# Patient Record
Sex: Female | Born: 1960 | Race: White | Hispanic: No | Smoking: Former smoker
Health system: Southern US, Community
[De-identification: ages and names within clinical notes are randomized; demographics above are authoritative.]

## PROBLEM LIST (undated history)

## (undated) DIAGNOSIS — F32A Depression, unspecified: Secondary | ICD-10-CM

## (undated) DIAGNOSIS — E119 Type 2 diabetes mellitus without complications: Secondary | ICD-10-CM

## (undated) DIAGNOSIS — F419 Anxiety disorder, unspecified: Secondary | ICD-10-CM

## (undated) DIAGNOSIS — F329 Major depressive disorder, single episode, unspecified: Secondary | ICD-10-CM

## (undated) DIAGNOSIS — M25519 Pain in unspecified shoulder: Secondary | ICD-10-CM

## (undated) DIAGNOSIS — R1011 Right upper quadrant pain: Secondary | ICD-10-CM

## (undated) DIAGNOSIS — K219 Gastro-esophageal reflux disease without esophagitis: Secondary | ICD-10-CM

## (undated) DIAGNOSIS — E785 Hyperlipidemia, unspecified: Secondary | ICD-10-CM

## (undated) HISTORY — PX: DIAGNOSTIC LAPAROSCOPY: SUR761

## (undated) HISTORY — PX: CHOLECYSTECTOMY: SHX55

## (undated) HISTORY — PX: EYE SURGERY: SHX253

## (undated) HISTORY — PX: COLONOSCOPY: SHX174

---

## 2003-11-02 ENCOUNTER — Other Ambulatory Visit: Payer: Self-pay

## 2009-08-21 ENCOUNTER — Ambulatory Visit: Payer: Self-pay | Admitting: Internal Medicine

## 2009-08-26 ENCOUNTER — Ambulatory Visit: Payer: Self-pay | Admitting: Internal Medicine

## 2009-09-23 ENCOUNTER — Ambulatory Visit: Payer: Self-pay | Admitting: Gastroenterology

## 2010-01-26 ENCOUNTER — Ambulatory Visit: Payer: Self-pay

## 2010-02-17 ENCOUNTER — Ambulatory Visit: Payer: Self-pay | Admitting: Surgery

## 2010-02-23 ENCOUNTER — Ambulatory Visit: Payer: Self-pay | Admitting: Surgery

## 2010-02-25 LAB — PATHOLOGY REPORT

## 2010-08-31 ENCOUNTER — Ambulatory Visit: Payer: Self-pay | Admitting: Internal Medicine

## 2011-06-24 ENCOUNTER — Ambulatory Visit: Payer: Self-pay | Admitting: Internal Medicine

## 2011-09-01 ENCOUNTER — Ambulatory Visit: Payer: Self-pay | Admitting: Internal Medicine

## 2011-11-16 ENCOUNTER — Ambulatory Visit: Payer: Self-pay | Admitting: Physical Medicine and Rehabilitation

## 2012-03-25 ENCOUNTER — Observation Stay: Payer: Self-pay | Admitting: Internal Medicine

## 2012-03-25 LAB — HEPATIC FUNCTION PANEL A (ARMC)
Albumin: 4.5 g/dL (ref 3.4–5.0)
Alkaline Phosphatase: 71 U/L (ref 50–136)
Bilirubin, Direct: <0.1 mg/dL (ref 0.00–0.20)
Bilirubin,Total: 0.3 mg/dL (ref 0.2–1.0)
SGOT(AST): 17 U/L (ref 15–37)
Total Protein: 7.4 g/dL (ref 6.4–8.2)

## 2012-03-25 LAB — BASIC METABOLIC PANEL
Chloride: 105 mmol/L (ref 98–107)
Creatinine: 0.8 mg/dL (ref 0.60–1.30)
EGFR (African American): >60
EGFR (Non-African Amer.): >60
Sodium: 139 mmol/L (ref 136–145)

## 2012-03-25 LAB — CBC
HCT: 37.6 % (ref 35.0–47.0)
HGB: 12.7 g/dL (ref 12.0–16.0)
MCH: 28.2 pg (ref 26.0–34.0)
MCV: 83 fL (ref 80–100)
Platelet: 336 10*3/uL (ref 150–440)
RBC: 4.52 10*6/uL (ref 3.80–5.20)
WBC: 9.5 10*3/uL (ref 3.6–11.0)

## 2012-03-25 LAB — CK TOTAL AND CKMB (NOT AT ARMC)
CK, Total: 45 U/L (ref 21–215)
CK-MB: <0.5 ng/mL — ABNORMAL LOW (ref 0.5–3.6)

## 2012-03-26 LAB — CBC WITH DIFFERENTIAL/PLATELET
Basophil #: 0.1 10*3/uL (ref 0.0–0.1)
Eosinophil %: 0.5 %
HCT: 34.5 % — ABNORMAL LOW (ref 35.0–47.0)
Lymphocyte %: 30.8 %
MCHC: 34.6 g/dL (ref 32.0–36.0)
Monocyte %: 6.5 %
Neutrophil #: 3.7 10*3/uL (ref 1.4–6.5)
Platelet: 270 10*3/uL (ref 150–440)
RBC: 4.13 10*6/uL (ref 3.80–5.20)
RDW: 15.5 % — ABNORMAL HIGH (ref 11.5–14.5)
WBC: 6 10*3/uL (ref 3.6–11.0)

## 2012-03-26 LAB — TROPONIN I
Troponin-I: <0.02 ng/mL
Troponin-I: <0.02 ng/mL

## 2012-03-26 LAB — BASIC METABOLIC PANEL
Anion Gap: 8 (ref 7–16)
Calcium, Total: 8.5 mg/dL (ref 8.5–10.1)
Chloride: 108 mmol/L — ABNORMAL HIGH (ref 98–107)
Co2: 28 mmol/L (ref 21–32)
Creatinine: 0.86 mg/dL (ref 0.60–1.30)
EGFR (African American): >60
Osmolality: 287 (ref 275–301)

## 2012-03-26 LAB — LIPID PANEL: HDL Cholesterol: 45 mg/dL (ref 40–60)

## 2012-03-26 LAB — HEMOGLOBIN A1C: Hemoglobin A1C: 5.3 % (ref 4.2–6.3)

## 2012-03-26 LAB — CK-MB: CK-MB: 0.7 ng/mL (ref 0.5–3.6)

## 2012-03-28 ENCOUNTER — Ambulatory Visit: Payer: Self-pay | Admitting: Internal Medicine

## 2012-11-03 ENCOUNTER — Ambulatory Visit: Payer: Self-pay | Admitting: Internal Medicine

## 2012-11-23 ENCOUNTER — Ambulatory Visit: Payer: Self-pay | Admitting: Physical Medicine and Rehabilitation

## 2013-01-12 ENCOUNTER — Ambulatory Visit: Payer: Self-pay | Admitting: Physical Medicine and Rehabilitation

## 2013-10-26 ENCOUNTER — Emergency Department: Payer: Self-pay | Admitting: Emergency Medicine

## 2013-10-26 LAB — BASIC METABOLIC PANEL
ANION GAP: 4 — AB (ref 7–16)
BUN: 10 mg/dL (ref 7–18)
CO2: 26 mmol/L (ref 21–32)
CREATININE: 0.86 mg/dL (ref 0.60–1.30)
Calcium, Total: 8.6 mg/dL (ref 8.5–10.1)
Chloride: 108 mmol/L — ABNORMAL HIGH (ref 98–107)
EGFR (Non-African Amer.): >60
Glucose: 127 mg/dL — ABNORMAL HIGH (ref 65–99)
Osmolality: 276 (ref 275–301)
POTASSIUM: 3.4 mmol/L — AB (ref 3.5–5.1)
Sodium: 138 mmol/L (ref 136–145)

## 2013-10-26 LAB — LIPASE, BLOOD: LIPASE: 148 U/L (ref 73–393)

## 2013-10-26 LAB — HEPATIC FUNCTION PANEL A (ARMC)
ALK PHOS: 70 U/L
ALT: 18 U/L (ref 12–78)
AST: 25 U/L (ref 15–37)
Albumin: 4 g/dL (ref 3.4–5.0)
BILIRUBIN TOTAL: 0.4 mg/dL (ref 0.2–1.0)
Total Protein: 7.1 g/dL (ref 6.4–8.2)

## 2013-10-26 LAB — CBC
HCT: 37.5 % (ref 35.0–47.0)
HGB: 12.4 g/dL (ref 12.0–16.0)
MCH: 27.4 pg (ref 26.0–34.0)
MCHC: 33.1 g/dL (ref 32.0–36.0)
MCV: 83 fL (ref 80–100)
PLATELETS: 283 10*3/uL (ref 150–440)
RBC: 4.53 10*6/uL (ref 3.80–5.20)
RDW: 14.9 % — ABNORMAL HIGH (ref 11.5–14.5)
WBC: 7.9 10*3/uL (ref 3.6–11.0)

## 2013-10-26 LAB — TROPONIN I: Troponin-I: <0.02 ng/mL

## 2013-10-26 LAB — D-DIMER(ARMC): D-Dimer: 111 ng/ml

## 2014-04-02 ENCOUNTER — Ambulatory Visit: Payer: Self-pay | Admitting: Family Medicine

## 2014-09-03 NOTE — Consult Note (Signed)
General Aspect 54 yo female with history of hypertension, hyperlipidemia and diabetes with prior tobacco abuse admitted with progressive shortness of breath and intermitant chest pain. She had a normal ekg and has ruled out for an mi.. Echo revealed normal lv  function with no wall motion abnormality. She states her shortness of breath incrased with taking Nucynta. She also uses a lot of nsaids daily.   Physical Exam:   GEN well developed, well nourished, no acute distress    HEENT PERRL, hearing intact to voice    NECK supple    RESP normal resp effort  clear BS  no use of accessory muscles    CARD Regular rate and rhythm  Normal, S1, S2  No murmur    ABD denies tenderness  normal BS  no Abdominal Bruits    LYMPH negative neck, negative axillae    EXTR negative cyanosis/clubbing, negative edema    SKIN normal to palpation    NEURO cranial nerves intact, motor/sensory function intact    PSYCH A+O to time, place, person   Review of Systems:   Subjective/Chief Complaint shortness of breath and chest pain    General: No Complaints    Skin: No Complaints    ENT: No Complaints    Eyes: No Complaints    Neck: No Complaints    Respiratory: Short of breath    Cardiovascular: Chest pain or discomfort    Gastrointestinal: No Complaints    Genitourinary: No Complaints    Vascular: No Complaints    Musculoskeletal: lower back pain    Neurologic: No Complaints    Hematologic: No Complaints    Endocrine: No Complaints    Psychiatric: No Complaints    Review of Systems: All other systems were reviewed and found to be negative    Medications/Allergies Reviewed Medications/Allergies reviewed     Sleep Apnea:    GERD:    Eye Surgery - Left:    Laparoscopy:    Cholecystectomy:   Home Medications: Medication Instructions Status  asa  takes  daily Active  B12 daily Active  Fish Oil     2 x daily Active  lorazepam 1 mg oral tablet 1   orally  HS Active  B Complex 50    1 daily Active  Nexium 40 mg oral delayed release capsule 1  orally  AM Active  Flonase 50 mcg/inh nasal spray 1 spray(s) nasal once a day, As Needed Active  Nucynta 75 mg oral tablet 1 tab(s) orally every 6 hours, As Needed- for Pain  Active  cyclobenzaprine 5 mg oral tablet 1 tab(s) orally 3 times a day, As Needed Active   EKG:   EKG Nml  NSR    Lortab 2.5/500: Itching  Oxycodone: N/V/Diarrhea, Itching  Other- Explain in Comments Line: Unknown    Impression 54 yo female admitted with chest pain and shortness of breath. Has ruled out for an mi and ekg is normal.Echo does not reveal any signficant abnormalities. Etiology of pain does not appear to be seocndary to acute coronary syndrome. Nsaid use may be playing a role as well as Nucynta. Would ambulate on telemetry this afternoon and if stable, discharge to home with outpatient follow up to inculdue funcitonal study. Will see early next week and proceed with further workup.    Plan 1. Continue current meds including asa 2. Decrease nsaid use 3,. Wean off Nucynta 4. OK for discharge and follow up as outpatient.   Electronic Signatures: Harold Hedge  A (MD)  (Signed 10-Nov-13 11:23)  Authored: General Aspect/Present Illness, History and Physical Exam, Review of System, Past Medical History, Home Medications, EKG , Allergies, Impression/Plan   Last Updated: 10-Nov-13 11:23 by Dalia HeadingFath, Draxton Luu A (MD)

## 2014-09-03 NOTE — Discharge Summary (Signed)
PATIENT NAME:  Debra Odonnell, Debra Odonnell MR#:  [id number] DATE OF BIRTH:  [birth year]  DATE OF ADMISSION:  03/25/2012 DATE OF DISCHARGE:  03/26/2012  ADMITTING DIAGNOSES: Chest discomfort, shortness of breath.   DISCHARGE DIAGNOSES:  1. Chest discomfort felt to be due to atypical chest pain, possibly gastrointestinal-related; however, the patient does have risk factors. She will be seen by Dr. Lady GaryFath as an outpatient and will have a stress test.  2. Weight loss. The patient is to follow up with her primary care provider to make sure she is up to date on all screening exams.  3. Orthostatic hypotension, possibly due to dehydration, improved with IV fluids.  4. Anxiety.  5. Chronic back pain.  6. Diet-controlled diabetes.  7. Hypertension.  8. Hyperlipidemia.  9. Degenerative disk disease.   PERTINENT LABS AND EVALUATIONS: Admitting glucose 103, BUN 8, creatinine 0.80, sodium 139, potassium 4.1, chloride 105, CO2 27, and calcium 9.5.   LFTs were normal.   Troponin was less than 0.02 x3. CPK was 4536. CK-MB was less than 0.5, 0.7, and 0.7.   WBC count was 9.5, hemoglobin 12.7, and platelet count was 337.   D-dimer was 0.26.  Echocardiogram of the heart showed a normal ejection fraction. There was some trace tricuspid regurgitation.   EKG showed normal sinus rhythm without any ST-T wave changes with rightward axis deviation.   Chest x-ray was negative.   CONSULTANTS: Harold HedgeKenneth Fath, MD  HOSPITAL COURSE: Please refer to the history and physical done by the admitting physician. The patient is a 54 year old white female with history of diet-controlled diabetes, hyperlipidemia, and hypertension who used to smoke but quit who has chronic back pain and has been using 6 to 8 tablets of Advil at least daily for the past several months. Presented with burning in the chest and discomfort. The patient also was having some dizziness with standing. She was evaluated in the ED. Her evaluation in the ED was negative,  except she was noted to be orthostatic. The patient was admitted, serial cardiac enzymes were done, and a cardiology consult was obtained. She had an echocardiogram which was normal. She was seen by Dr. Lady GaryFath who in light of her cardiac enzymes being negative and the having no symptoms during the hospitalization with exertion, she was stable for discharge. She will follow up with Dr. Lady GaryFath as an outpatient for further evaluation. At this time, she is stable for discharge.   DISCHARGE MEDICATIONS:  1. Aspirin 81 one tab p.o. daily.  2. B12 250 mcg daily.  3. Fish oil 1000 mg 2 tabs daily.  4. Lorazepam 1 mg at bedtime.  5. B complex 1 tab p.o. daily.  6. Flonase one spray daily as needed. 7. Nucynta 75 mg 1 tab p.o. every six hours as made for pain, as taking previously. 8. Cyclobenzaprine  5 mg 1 tab p.o. three times daily.  9.  Nexium 40 mg 1 tab p.o. twice a day.  HOME OXYGEN: None.   DIET: Low fat, low cholesterol, carbohydrate-controlled, regular consistency.   ACTIVITY: As tolerated.       DISCHARGE FOLLOWUP: Follow up with Dr. Randa LynnLamb in 1 to 2 weeks. Follow up with Dr. Lady GaryFath in 5 to 7 days for stress test.   TIME SPENT: 35 minutes. ____________________________ Lacie ScottsShreyang H. Allena KatzPatel, MD shp:slb D: 03/27/2012 09:12:41 ET T: 03/27/2012 13:04:37 ET JOB#: [id number]  cc: Vola Beneke H. Allena KatzPatel, MD, <Dictator> Reola MosherAndrew Odonnell. Randa LynnLamb, MD Charise CarwinSHREYANG H Latorya Bautch MD ELECTRONICALLY SIGNED 03/30/2012 13:28

## 2014-09-03 NOTE — H&P (Signed)
PATIENT NAME:  Debra Odonnell, Debra Odonnell MR#:  [id number] DATE OF BIRTH:  [birth year]  DATE OF ADMISSION:  03/25/2012  PCP: Dr. Randa LynnLamb    REFERRING PHYSICIAN: Dr. Enedina FinnerGoli    CHIEF COMPLAINT: Burning in the chest and chest discomfort.   HISTORY OF PRESENT ILLNESS: The patient is a pleasant 54 year old Caucasian female with history of diet controlled diabetes, hyperlipidemia, hypertension, and ex-smoker, quit four years ago, who presents with the above chief complaint. The patient stated that originally she started to have some shortness of breath a couple of days ago. Then she started to have some dyspnea on exertion mostly with activity. The patient started to have burning in her throat which went down and turned into burning in her chest. She denies having any pains in the chest. There is no shortness of breath when she is inactive. The patient has a negative troponin. The patient also complained of a bad metallic taste in her mouth. Of note, the patient has been taking 6 to 8 tabs of Advil at least daily for the past several months. She also states that she was under a lot of stress for the past six months and is undergoing separation with her husband. Of note, the patient was noted to have some tachycardia. She complains of orthostatic dizziness. Hospitalist services were contacted for further evaluation and management.   PAST MEDICAL HISTORY:  1. Ex-smoker.  2. Diet controlled diabetes.  3. Hypertension, which is better controlled.  4. Hyperlipidemia, which is diet controlled. 5. Degenerative disk disease. 6. Anxiety.  7. Chronic back pain.   SOCIAL HISTORY: No tobacco currently. Last smoked four years ago but smoked for 30 years. No alcohol or drug use.   MEDICATIONS:  1. Aspirin 81 mg daily.  2. B Complex 1 tab daily.  3. Vitamin B12 250 mcg daily.  4. Cyclobenzaprine 5 mg 3 times a day as needed. 5. Fish Oil 1 gram 2 times a day.  6. Flonase 50 mcg one spray once a day as needed. 7. Lorazepam 1 mg  at bedtime.  8. Nexium 40 mg daily. 9. Nucynta 75 mg tablet 1 tab every six hours as needed for pain. 10. Advil Extra Strength at least 6 tabs a day for multiple months now.   ALLERGIES: Lortab and anything with codeine as well as oxycodone.   FAMILY HISTORY: Dad with stomach cancer. Multiple family members with cancer and coronary artery disease.   REVIEW OF SYSTEMS: CONSTITUTIONAL: No fever. Positive for fatigue and weight loss as above. EYES: No blurry vision or double vision. ENT: No tinnitus or hearing loss. No postnasal drip. Some burning in her throat. No swelling in her neck. RESPIRATORY: No cough, wheezing. Some dyspnea on exertion. No history of CHF or MI. CARDIOVASCULAR: No chest pain. No orthopnea. No edema. No syncope. GI: Some nausea. No vomiting, diarrhea, abdominal pain, or hematemesis. No melena or rectal bleeding. GU: Denies dysuria or incontinence. ENDOCRINE: No polyuria or nocturia. No thyroid problems. HEME/LYMPH: No anemia or easy bruising. SKIN: No new rashes. MUSCULOSKELETAL: Chronic back and right lower extremity pain. NEUROLOGIC: Some numbness in the right lower extremity. PSYCHIATRIC: Anxiety.   PHYSICAL EXAMINATION:   VITAL SIGNS: Temperature on arrival 97.9, pulse rate 117, respiratory rate 20, blood pressure on arrival 122/83, oxygen sats 100% on room air. Last blood pressure 101/61. Orthostatics while laying pulse 98, on standing 117. While laying blood pressure was 123/74 and while laying 104/67.   GENERAL: The patient is a thin Caucasian female laying  in bed in no obvious distress.   HEENT: Normocephalic and atraumatic. Pupils are equal and reactive. Anicteric sclerae. Dry mucous membranes. No tonsillar exudates.   NECK: Supple. No thyroid tenderness. No cervical lymphadenopathy.   CARDIOVASCULAR: S1, S2, tachycardic. No murmurs, rubs, or gallops.   LUNGS: Clear to auscultation without wheezing, rhonchi, or crackling.   ABDOMEN: Soft, nontender, nondistended.  No organomegaly noted.   EXTREMITIES: No significant lower extremity edema.   NEUROLOGICAL: Cranial nerves II through XII grossly intact. Strength is 5/5 in all extremities. Sensation intact to light touch.   PSYCH: Awake, alert, oriented x3 cooperative, conversant.   LABORATORY, DIAGNOSTIC, AND RADIOLOGICAL DATA: Glucose 103, BUN 8, creatinine 0.8, sodium 139, potassium 4.1, chloride 105, anion gap 7. LFTs within normal limits. CK-MB negative. Troponin negative. WBC 9.5, hemoglobin 12.7, platelets 336. D-dimer 0.26.  X-ray of the chest, PA and lateral, no acute disease of the chest.  EKG on arrival showing normal sinus rhythm with sinus arrhythmia, rate 91, no acute ST elevations or depressions.   ASSESSMENT AND PLAN: We have a pleasant 54 year old Caucasian female with history of diet controlled diabetes, hypertension, and hyperlipidemia who is an ex-smoker who presents with chronic degenerative disk disease who has been taking excessive Advil for several months and presents with some chest discomfort and some dyspnea on exertion. She also has positional dizziness. At this point we will admit the patient to the hospital and rule out MI given multiple risk factors she has had. She has a negative troponin. We would cycle the troponins and obtain and echocardiogram. Check a lipid profile, check a hemoglobin A1c, and obtain a Cardiology consult. I discussed with the patient that we don't do any stress tests on Sunday and if she does rule out for acute coronary syndrome she could get an outpatient stress test and she is okay with that. It is also possible that her symptoms are GERD/PUD related. The patient has a bad metallic taste in her mouth, has burning in her throat and her substernal area, and has nausea and has been on chronic NSAIDs. We would change the Nexium to b.i.d. and stop all NSAIDs. I would also recommend outpatient EGD. She also does endorse some weight loss which initially was intentional  but now is not intentional. She also has somewhat orthostatic with orthostatic vitals close to being positive and patient clinically gets dizzy and appears dehydrated. I will start her on some IV fluids. I would resume her chronic pain medications as well as her Ativan for anxiety. X-ray of the chest is pretty much negative for effusions or pneumonia. She clinically does not appear to be in CHF in regards to the shortness of breath and has a negative D-dimer as well. She is not hypoxic either. Will see what the echo shows.   CODE STATUS: The patient is FULL CODE.   TOTAL TIME SPENT: 55 minutes.   ____________________________ Krystal Eaton, MD sa:drc D: 03/25/2012 20:37:22 ET T: 03/26/2012 08:42:25 ET JOB#: [id number]  cc: Krystal Eaton, MD, <Dictator> Reola Mosher. Randa Lynn, MD Krystal Eaton MD ELECTRONICALLY SIGNED 04/04/2012 13:31

## 2016-04-30 ENCOUNTER — Encounter: Payer: Self-pay | Admitting: *Deleted

## 2016-05-03 ENCOUNTER — Ambulatory Visit: Payer: Managed Care, Other (non HMO) | Admitting: Anesthesiology

## 2016-05-03 ENCOUNTER — Encounter
Admission: RE | Disposition: A | Discharge status: Home / Self Care | Payer: Self-pay | Source: Ambulatory Visit | Attending: Unknown Physician Specialty

## 2016-05-03 ENCOUNTER — Ambulatory Visit
Admission: RE | Admit: 2016-05-03 | Discharge: 2016-05-03 | Disposition: A | Discharge status: Home / Self Care | Payer: Managed Care, Other (non HMO) | Source: Ambulatory Visit | Attending: Unknown Physician Specialty | Admitting: Unknown Physician Specialty

## 2016-05-03 DIAGNOSIS — F329 Major depressive disorder, single episode, unspecified: Secondary | ICD-10-CM | POA: Diagnosis not present

## 2016-05-03 DIAGNOSIS — Z79899 Other long term (current) drug therapy: Secondary | ICD-10-CM | POA: Insufficient documentation

## 2016-05-03 DIAGNOSIS — J449 Chronic obstructive pulmonary disease, unspecified: Secondary | ICD-10-CM | POA: Insufficient documentation

## 2016-05-03 DIAGNOSIS — K644 Residual hemorrhoidal skin tags: Secondary | ICD-10-CM | POA: Insufficient documentation

## 2016-05-03 DIAGNOSIS — Z7982 Long term (current) use of aspirin: Secondary | ICD-10-CM | POA: Diagnosis not present

## 2016-05-03 DIAGNOSIS — F419 Anxiety disorder, unspecified: Secondary | ICD-10-CM | POA: Diagnosis not present

## 2016-05-03 DIAGNOSIS — K640 First degree hemorrhoids: Secondary | ICD-10-CM | POA: Diagnosis not present

## 2016-05-03 DIAGNOSIS — K219 Gastro-esophageal reflux disease without esophagitis: Secondary | ICD-10-CM | POA: Insufficient documentation

## 2016-05-03 DIAGNOSIS — E785 Hyperlipidemia, unspecified: Secondary | ICD-10-CM | POA: Insufficient documentation

## 2016-05-03 DIAGNOSIS — Z1211 Encounter for screening for malignant neoplasm of colon: Secondary | ICD-10-CM | POA: Insufficient documentation

## 2016-05-03 DIAGNOSIS — E119 Type 2 diabetes mellitus without complications: Secondary | ICD-10-CM | POA: Insufficient documentation

## 2016-05-03 DIAGNOSIS — Z87891 Personal history of nicotine dependence: Secondary | ICD-10-CM | POA: Insufficient documentation

## 2016-05-03 HISTORY — DX: Pain in unspecified shoulder: M25.519

## 2016-05-03 HISTORY — DX: Right upper quadrant pain: R10.11

## 2016-05-03 HISTORY — DX: Hyperlipidemia, unspecified: E78.5

## 2016-05-03 HISTORY — DX: Major depressive disorder, single episode, unspecified: F32.9

## 2016-05-03 HISTORY — PX: COLONOSCOPY WITH PROPOFOL: SHX5780

## 2016-05-03 HISTORY — DX: Anxiety disorder, unspecified: F41.9

## 2016-05-03 HISTORY — DX: Depression, unspecified: F32.A

## 2016-05-03 HISTORY — DX: Type 2 diabetes mellitus without complications: E11.9

## 2016-05-03 HISTORY — DX: Gastro-esophageal reflux disease without esophagitis: K21.9

## 2016-05-03 LAB — POCT PREGNANCY, URINE: PREG TEST UR: NEGATIVE

## 2016-05-03 SURGERY — COLONOSCOPY WITH PROPOFOL
Anesthesia: General

## 2016-05-03 MED ORDER — MIDAZOLAM HCL 5 MG/5ML IJ SOLN
INTRAMUSCULAR | Status: DC | PRN
  Administered 2016-05-03 (×2): 1 mg via INTRAVENOUS

## 2016-05-03 MED ORDER — LIDOCAINE HCL (PF) 2 % IJ SOLN
INTRAMUSCULAR | Status: DC | PRN
  Administered 2016-05-03: 50 mg

## 2016-05-03 MED ORDER — PROPOFOL 10 MG/ML IV BOLUS
INTRAVENOUS | Status: DC | PRN
  Administered 2016-05-03: 20 mg via INTRAVENOUS
  Administered 2016-05-03: 50 mg via INTRAVENOUS

## 2016-05-03 MED ORDER — FENTANYL CITRATE (PF) 100 MCG/2ML IJ SOLN: INTRAMUSCULAR | Status: DC | PRN

## 2016-05-03 MED ORDER — PHENYLEPHRINE HCL 10 MG/ML IJ SOLN
INTRAMUSCULAR | Status: DC | PRN
  Administered 2016-05-03 (×2): 100 ug via INTRAVENOUS

## 2016-05-03 MED ORDER — PROPOFOL 500 MG/50ML IV EMUL
INTRAVENOUS | Status: DC | PRN
  Administered 2016-05-03: 75 ug/kg/min via INTRAVENOUS

## 2016-05-03 MED ORDER — SODIUM CHLORIDE 0.9 % IV SOLN
INTRAVENOUS | Status: DC
  Administered 2016-05-03: 09:00:00 via INTRAVENOUS

## 2016-05-03 NOTE — Anesthesia Postprocedure Evaluation (Signed)
Anesthesia Post Note  Patient: Debra CroakRonda S Gorelik  Procedure(s) Performed: Procedure(s) (LRB): COLONOSCOPY WITH PROPOFOL (N/A)  Patient location during evaluation: PACU Anesthesia Type: General Level of consciousness: awake Pain management: pain level controlled Vital Signs Assessment: post-procedure vital signs reviewed and stable Respiratory status: spontaneous breathing Cardiovascular status: stable Anesthetic complications: no    Last Vitals:  Vitals:   05/03/16 1024 05/03/16 1034  BP: 100/63 106/62  Pulse: 73 71  Resp: 13 16  Temp:      Last Pain:  Vitals:   05/03/16 1004  TempSrc: Tympanic                 VAN STAVEREN,Advaith Lamarque

## 2016-05-03 NOTE — H&P (Signed)
Primary Care Physician:  Rozanna BoxBABAOFF, MARC E, MD Primary Gastroenterologist:  Dr. Mechele CollinElliott  Pre-Procedure History & Physical: HPI:  Debra Odonnell is a 55 y.o. female is here for an colonoscopy.   Past Medical History:  Diagnosis Date  . Anxiety   . Depression   . Diabetes mellitus without complication (HCC)    diet controlled  . GERD (gastroesophageal reflux disease)   . Hyperlipidemia   . RUQ pain   . Shoulder pain     Past Surgical History:  Procedure Laterality Date  . CHOLECYSTECTOMY    . COLONOSCOPY    . DIAGNOSTIC LAPAROSCOPY    . EYE SURGERY      Prior to Admission medications   Medication Sig Start Date End Date Taking? Authorizing Provider  buPROPion (WELLBUTRIN XL) 150 MG 24 hr tablet Take 150 mg by mouth daily.   Yes Historical Provider, MD  esomeprazole (NEXIUM) 40 MG capsule Take 40 mg by mouth daily at 12 noon.   Yes Historical Provider, MD  lisdexamfetamine (VYVANSE) 50 MG capsule Take 50 mg by mouth daily.   Yes Historical Provider, MD  meloxicam (MOBIC) 15 MG tablet Take 15 mg by mouth daily.   Yes Historical Provider, MD  methocarbamol (ROBAXIN) 750 MG tablet Take 750 mg by mouth every 8 (eight) hours as needed for muscle spasms.   Yes Historical Provider, MD  aspirin EC 81 MG tablet Take 81 mg by mouth daily.    Historical Provider, MD  b complex vitamins tablet Take 1 tablet by mouth daily.    Historical Provider, MD  LORazepam (ATIVAN) 1 MG tablet Take 1 mg by mouth 3 (three) times daily.    Historical Provider, MD  Omega-3 Fatty Acids (FISH OIL CONCENTRATE) 1000 MG CAPS Take 2 capsules by mouth.    Historical Provider, MD  tapentadol HCl (NUCYNTA) 75 MG tablet Take 75 mg by mouth.    Historical Provider, MD  vitamin B-12 (CYANOCOBALAMIN) 1000 MCG tablet Take 1,000 mcg by mouth daily.    Historical Provider, MD    Allergies as of 04/21/2016  . (Not on File)    History reviewed. No pertinent family history.  Social History   Social History  .  Marital status: Married    Spouse name: N/A  . Number of children: N/A  . Years of education: N/A   Occupational History  . Not on file.   Social History Main Topics  . Smoking status: Former Games developermoker  . Smokeless tobacco: Never Used  . Alcohol use Yes     Comment: 2-3 months ago mixed drink  . Drug use: No  . Sexual activity: Not on file   Other Topics Concern  . Not on file   Social History Narrative  . No narrative on file    Review of Systems: See HPI, otherwise negative ROS  Physical Exam: BP 113/81   Pulse 82   Temp 97.1 F (36.2 C) (Tympanic)   Resp 16   Ht 5' 0.5" (1.537 m)   Wt 50.8 kg (112 lb)   LMP 01/02/2016   SpO2 100%   BMI 21.51 kg/m  General:   Alert,  pleasant and cooperative in NAD Head:  Normocephalic and atraumatic. Neck:  Supple; no masses or thyromegaly. Lungs:  Clear throughout to auscultation.    Heart:  Regular rate and rhythm. Abdomen:  Soft, nontender and nondistended. Normal bowel sounds, without guarding, and without rebound.   Neurologic:  Alert and  oriented x4;  grossly  normal neurologically.  Impression/Plan: Debra Odonnell is here for an colonoscopy to be performed for screening colonoscopy  Risks, benefits, limitations, and alternatives regarding  colonoscopy have been reviewed with the patient.  Questions have been answered.  All parties agreeable.   Lynnae PrudeELLIOTT, ROBERT, MD  05/03/2016, 9:35 AM

## 2016-05-03 NOTE — Anesthesia Preprocedure Evaluation (Signed)
Anesthesia Evaluation  Patient identified by MRN, date of birth, ID band Patient awake    Reviewed: Allergy & Precautions  Airway Mallampati: III       Dental  (+) Teeth Intact   Pulmonary COPD, former smoker,     + decreased breath sounds      Cardiovascular Exercise Tolerance: Good  Rhythm:Regular Rate:Normal     Neuro/Psych    GI/Hepatic Neg liver ROS, GERD  Medicated,  Endo/Other  diabetes, Type 2  Renal/GU      Musculoskeletal   Abdominal Normal abdominal exam  (+)   Peds negative pediatric ROS (+)  Hematology   Anesthesia Other Findings   Reproductive/Obstetrics                             Anesthesia Physical Anesthesia Plan  ASA: II  Anesthesia Plan: General   Post-op Pain Management:    Induction: Intravenous  Airway Management Planned: Natural Airway and Nasal Cannula  Additional Equipment:   Intra-op Plan:   Post-operative Plan:   Informed Consent: I have reviewed the patients History and Physical, chart, labs and discussed the procedure including the risks, benefits and alternatives for the proposed anesthesia with the patient or authorized representative who has indicated his/her understanding and acceptance.     Plan Discussed with: CRNA  Anesthesia Plan Comments:         Anesthesia Quick Evaluation

## 2016-05-03 NOTE — Op Note (Signed)
[facility] Gastroenterology Patient Name: Debra BalsamRonda Tercero Procedure Date: 05/03/2016 9:10 AM MRN: [id number] Account #: [id number] Date of Birth: 08-07-1960 Admit Type: Outpatient Age: [age] [facility] Gender: Female Note Status: Finalized Procedure:            Colonoscopy Indications:          Screening for colorectal malignant neoplasm Providers:            Scot Junobert T. Vivika Poythress, MD Referring MD:         Hassell HalimMarcus E. Babaoff MD (Referring MD) Medicines:            Propofol per Anesthesia Complications:        No immediate complications. Procedure:            Pre-Anesthesia Assessment:                       - After reviewing the risks and benefits, the patient                        was deemed in satisfactory condition to undergo the                        procedure.                       After obtaining informed consent, the colonoscope was                        passed under direct vision. Throughout the procedure,                        the patient's blood pressure, pulse, and oxygen                        saturations were monitored continuously. The                        Colonoscope was introduced through the anus and                        advanced to the the cecum, identified by appendiceal                        orifice and ileocecal valve. The colonoscopy was                        performed without difficulty. The patient tolerated the                        procedure well. The quality of the bowel preparation                        was excellent. Findings:      External and internal hemorrhoids were found during endoscopy. The       hemorrhoids were small and Grade I (internal hemorrhoids that do not       prolapse).      The exam was otherwise without abnormality. Impression:           - External and internal hemorrhoids.                       -  The examination was otherwise normal.                       - No specimens collected. Recommendation:        - Repeat colonoscopy in 10 years for screening purposes. Scot Junobert T Ebonye Reade, MD 05/03/2016 10:01:51 AM This report has been signed electronically. Number of Addenda: 0 Note Initiated On: 05/03/2016 9:10 AM Scope Withdrawal Time: 0 hours 6 minutes 30 seconds  Total Procedure Duration: 0 hours 17 minutes 59 seconds       [facility]

## 2016-05-03 NOTE — Transfer of Care (Signed)
Immediate Anesthesia Transfer of Care Note  Patient: Debra Odonnell  Procedure(s) Performed: Procedure(s): COLONOSCOPY WITH PROPOFOL (N/A)  Patient Location: PACU  Anesthesia Type:General  Level of Consciousness: sedated  Airway & Oxygen Therapy: Patient Spontanous Breathing and Patient connected to nasal cannula oxygen  Post-op Assessment: Report given to RN and Post -op Vital signs reviewed and stable  Post vital signs: Reviewed and stable  Last Vitals:  Vitals:   05/03/16 0851  BP: 113/81  Pulse: 82  Resp: 16  Temp: 36.2 C    Last Pain:  Vitals:   05/03/16 0851  TempSrc: Tympanic         Complications: No apparent anesthesia complications

## 2016-05-04 ENCOUNTER — Encounter: Payer: Self-pay | Admitting: Unknown Physician Specialty

## 2017-02-21 ENCOUNTER — Emergency Department: Payer: Managed Care, Other (non HMO)

## 2017-02-21 ENCOUNTER — Emergency Department
Admission: EM | Admit: 2017-02-21 | Discharge: 2017-02-21 | Disposition: A | Discharge status: Home / Self Care | Payer: Managed Care, Other (non HMO) | Attending: Emergency Medicine | Admitting: Emergency Medicine

## 2017-02-21 DIAGNOSIS — Z7982 Long term (current) use of aspirin: Secondary | ICD-10-CM | POA: Insufficient documentation

## 2017-02-21 DIAGNOSIS — K5901 Slow transit constipation: Secondary | ICD-10-CM | POA: Insufficient documentation

## 2017-02-21 DIAGNOSIS — Z79899 Other long term (current) drug therapy: Secondary | ICD-10-CM | POA: Diagnosis not present

## 2017-02-21 DIAGNOSIS — E119 Type 2 diabetes mellitus without complications: Secondary | ICD-10-CM | POA: Diagnosis not present

## 2017-02-21 DIAGNOSIS — Z87891 Personal history of nicotine dependence: Secondary | ICD-10-CM | POA: Insufficient documentation

## 2017-02-21 DIAGNOSIS — K5900 Constipation, unspecified: Secondary | ICD-10-CM | POA: Diagnosis present

## 2017-02-21 LAB — POCT PREGNANCY, URINE: Preg Test, Ur: NEGATIVE

## 2017-02-21 MED ORDER — POLYETHYLENE GLYCOL 3350 17 G PO PACK
17.0000 g | PACK | Freq: Every day | ORAL | Status: DC
  Administered 2017-02-21: 17 g via ORAL

## 2017-02-21 MED ORDER — POLYETHYLENE GLYCOL 3350 17 G PO PACK
PACK | ORAL | Status: AC
  Filled 2017-02-21: qty 1

## 2017-02-21 MED ORDER — BISACODYL 10 MG RE SUPP
10.0000 mg | Freq: Once | RECTAL | Status: DC
  Filled 2017-02-21 (×2): qty 1

## 2017-02-21 NOTE — ED Provider Notes (Signed)
[facility] Emergency Department Provider Note   ____________________________________________    I have reviewed the triage vital signs and the nursing notes.   HISTORY  Chief Complaint Constipation     HPI Debra Odonnell is a 56 y.o. female Who presents with complaints of constipation. Patient reports she hasn't had a bowel movement in at least 5-6 days. She reports she feels bloated and is having cramping abdominal pain. Mild nausea. No vomiting. She has taken enemas and magnesium citrate without relief   Past Medical History:  Diagnosis Date  . Anxiety   . Depression   . Diabetes mellitus without complication (HCC)    diet controlled  . GERD (gastroesophageal reflux disease)   . Hyperlipidemia   . RUQ pain   . Shoulder pain     There are no active problems to display for this patient.   Past Surgical History:  Procedure Laterality Date  . CHOLECYSTECTOMY    . COLONOSCOPY    . COLONOSCOPY WITH PROPOFOL N/A 05/03/2016   Procedure: COLONOSCOPY WITH PROPOFOL;  Surgeon: Scot Jun, MD;  Location: [facility] ENDOSCOPY;  Service: Endoscopy;  Laterality: N/A;  . DIAGNOSTIC LAPAROSCOPY    . EYE SURGERY      Prior to Admission medications   Medication Sig Start Date End Date Taking? Authorizing Provider  aspirin EC 81 MG tablet Take 81 mg by mouth daily.    [provider]  b complex vitamins tablet Take 1 tablet by mouth daily.    [provider]  buPROPion (WELLBUTRIN XL) 150 MG 24 hr tablet Take 150 mg by mouth daily.    [provider]  esomeprazole (NEXIUM) 40 MG capsule Take 40 mg by mouth daily at 12 noon.    [provider]  lisdexamfetamine (VYVANSE) 50 MG capsule Take 50 mg by mouth daily.    [provider]  LORazepam (ATIVAN) 1 MG tablet Take 1 mg by mouth 3 (three) times daily.    [provider]  meloxicam (MOBIC) 15 MG tablet Take 15 mg by mouth daily.    [provider]  methocarbamol (ROBAXIN) 750 MG tablet Take 750 mg by mouth every 8 (eight) hours as needed for muscle spasms.    [provider]  Omega-3 Fatty Acids (FISH OIL CONCENTRATE) 1000 MG CAPS Take 2 capsules by mouth.    [provider]  tapentadol HCl (NUCYNTA) 75 MG tablet Take 75 mg by mouth.    [provider]  vitamin B-12 (CYANOCOBALAMIN) 1000 MCG tablet Take 1,000 mcg by mouth daily.    [provider]     Allergies Dicyclomine; Ketorolac; Lortab [hydrocodone-acetaminophen]; and Metformin and related  No family history on file.  Social History Social History  Substance Use Topics  . Smoking status: Former Games developer  . Smokeless tobacco: Never Used  . Alcohol use Yes     Comment: 2-3 months ago mixed drink    Review of Systems  Constitutional: No fever/chills Eyes: No visual changes.  ENT: No sore throat. Cardiovascular: Denies chest pain. Respiratory: Denies shortness of breath. Gastrointestinal: as above Genitourinary: Negative for dysuria. Musculoskeletal: Negative for back pain. Skin: Negative for rash. Neurological: Negative for headaches   ____________________________________________   PHYSICAL EXAM:  VITAL SIGNS: ED Triage Vitals  Enc Vitals Group     BP 02/21/17 1956 117/78     Pulse Rate 02/21/17 1956 89     Resp 02/21/17 1956 18     Temp 02/21/17  1956 98.4 F (36.9 C)     Temp Source 02/21/17 1956 Oral     SpO2 02/21/17 1956 100 %     Weight 02/21/17 1956 54.4 kg (120 lb)     Height 02/21/17 1956 1.524 m (5')     Head Circumference --      Peak Flow --      Pain Score 02/21/17 2208 4     Pain Loc --      Pain Edu? --      Excl. in GC? --     Constitutional: Alert and oriented. No acute distress. Pleasant and interactive Eyes: Conjunctivae are normal.     Cardiovascular: Normal rate, regular rhythm. Grossly normal heart sounds.  Good peripheral circulation. Respiratory: Normal respiratory  effort.  No retractions. Lungs CTAB. Gastrointestinal: mild distention, no significant tenderness to palpation  No CVA tenderness. Genitourinary: deferred Musculoskeletal: Warm and well perfused Neurologic:  Normal speech and language. No gross focal neurologic deficits are appreciated.  Skin:  Skin is warm, dry and intact. No rash noted. Psychiatric: Mood and affect are normal. Speech and behavior are normal.  ____________________________________________   LABS (all labs ordered are listed, but only abnormal results are displayed)  Labs Reviewed  POCT PREGNANCY, URINE   ____________________________________________  EKG  None ____________________________________________  RADIOLOGY  KUB unremarkable ____________________________________________   PROCEDURES  Procedure(s) performed: No    Critical Care performed: No ____________________________________________   INITIAL IMPRESSION / ASSESSMENT AND PLAN / ED COURSE  Pertinent labs & imaging results that were available during my care of the patient were reviewed by me and considered in my medical decision making (see chart for details).  patient well-appearing and in no acute distress. Mild abdominal distention, KUB overall unremarkable, significant amount of gas. We will treat with MiraLAX and Dulcolax suppository  Patient had large bowel movement and feels much better. No further treatment in the emergency department, okay for discharge at this time    ____________________________________________   FINAL CLINICAL IMPRESSION(S) / ED DIAGNOSES  Final diagnoses:  Slow transit constipation      NEW MEDICATIONS STARTED DURING THIS VISIT:  Discharge Medication List as of 02/21/2017  9:56 PM       Note:  This document was prepared using Dragon voice recognition software and may include unintentional dictation errors.    Jene Every, MD 02/21/17 [phone]

## 2017-02-21 NOTE — ED Notes (Signed)
Called pharmacy again for dulcolax.

## 2017-02-21 NOTE — ED Notes (Signed)
Called pharmacy for dulcolax suppository

## 2017-02-21 NOTE — ED Notes (Signed)
Went to update pt on delay of the suppository. Pt stated she has been moving her bowels. Pt states she feels a little better, states still feels pressure but is passing gas. Pt states she wants to go home and go to bed. Dr. Cyril Loosen updated.

## 2017-02-21 NOTE — ED Triage Notes (Signed)
Pt states that her last BM was Tuesday or Wednesday of last week. Pt reports lower abd pain, side pain, back pain, and rectal pain, pt states that she has drank mag citrate and used 2 enemas without success

## 2019-02-06 IMAGING — CR DG ABDOMEN 1V
1 series · 1 of 1 positions shown · non-contrast
Comparison: None.

CLINICAL DATA: Lower abdominal pain and rectal pain.  Constipation.

EXAM:
ABDOMEN - 1 VIEW

[dg abd 1 view]
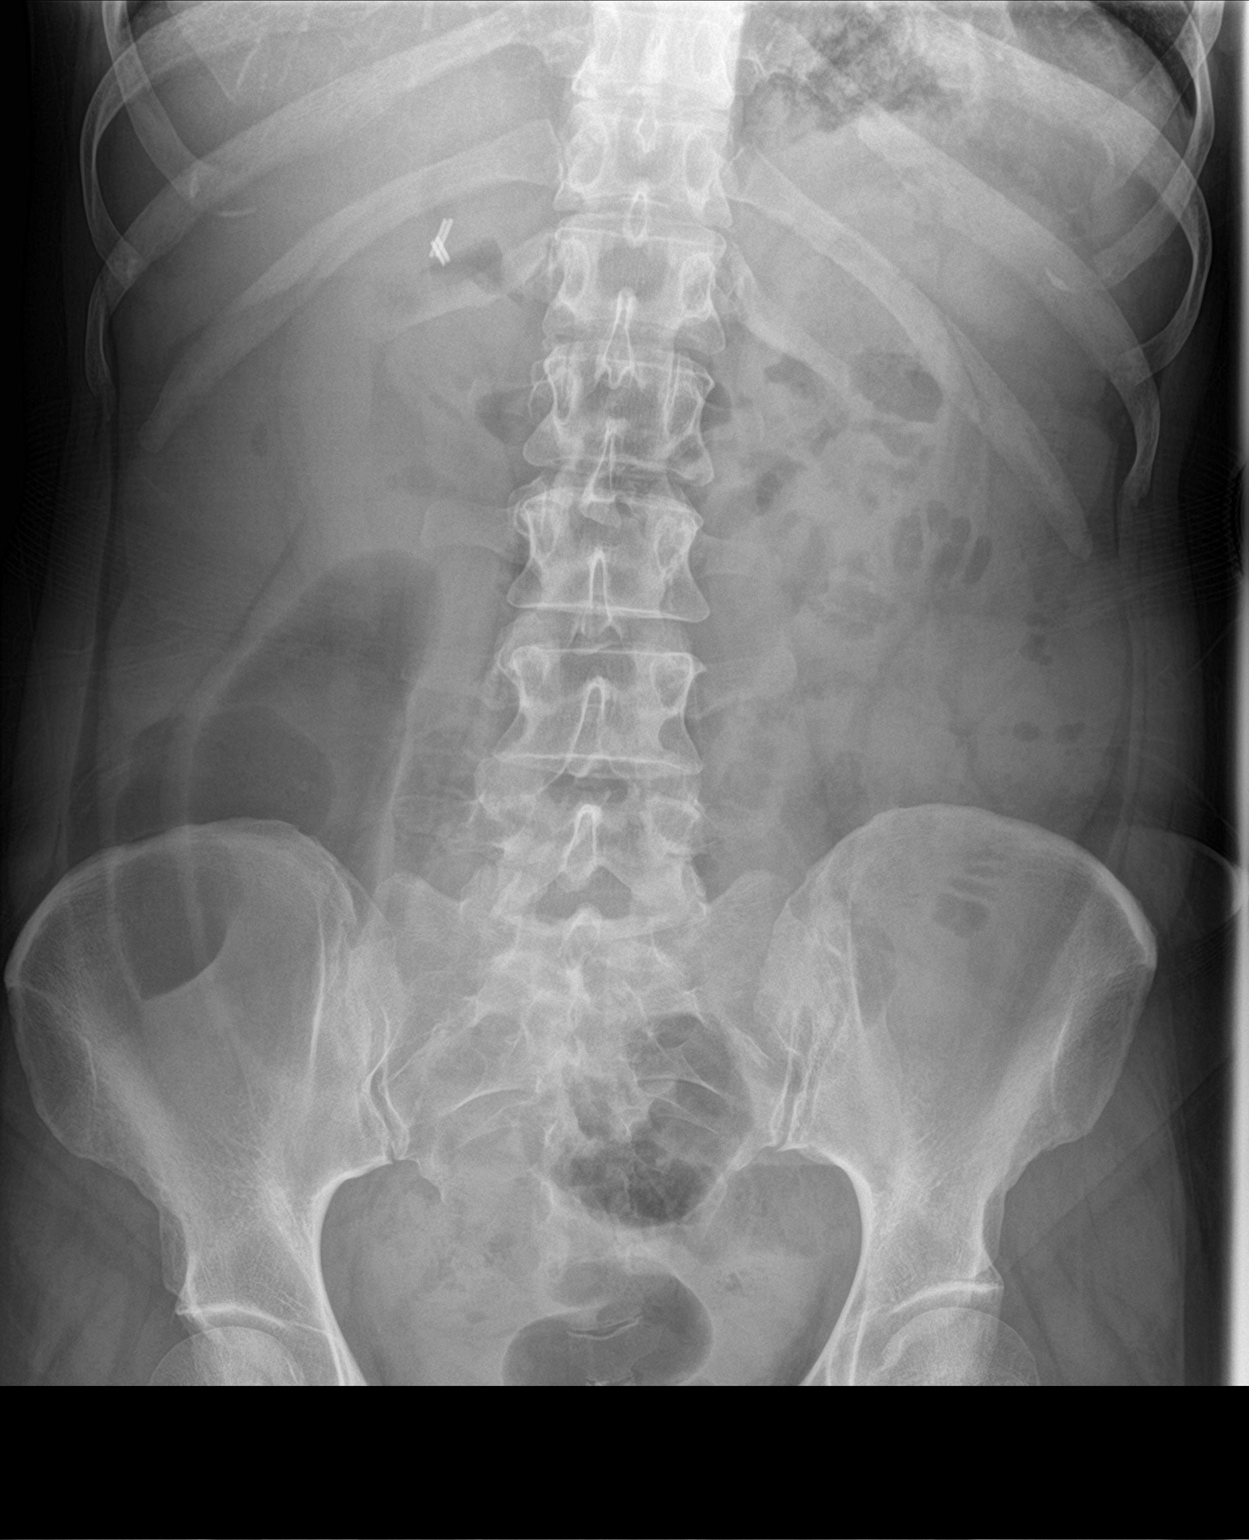

[1 of 1 positions shown; findings below may reference images not displayed]

FINDINGS: Generous volume air throughout small and large bowel. No evidence of
obstruction or perforation. Right upper quadrant surgical clips
noted. No biliary or urinary calculi are evident.
IMPRESSION: No radiographic evidence of bowel obstruction or perforation.
Generous volume air throughout small and large bowel.

## 2022-09-21 ENCOUNTER — Other Ambulatory Visit: Payer: Self-pay

## 2022-09-21 DIAGNOSIS — Z1231 Encounter for screening mammogram for malignant neoplasm of breast: Secondary | ICD-10-CM

## 2023-09-14 ENCOUNTER — Other Ambulatory Visit: Payer: Self-pay | Admitting: Internal Medicine

## 2023-09-14 DIAGNOSIS — I483 Typical atrial flutter: Secondary | ICD-10-CM

## 2023-09-14 DIAGNOSIS — R0789 Other chest pain: Secondary | ICD-10-CM

## 2023-09-26 ENCOUNTER — Other Ambulatory Visit: Payer: Self-pay | Admitting: Obstetrics and Gynecology

## 2023-09-26 DIAGNOSIS — Z1231 Encounter for screening mammogram for malignant neoplasm of breast: Secondary | ICD-10-CM

## 2023-09-28 ENCOUNTER — Telehealth (HOSPITAL_COMMUNITY): Payer: Self-pay | Admitting: *Deleted

## 2023-09-28 MED ORDER — METOPROLOL TARTRATE 100 MG PO TABS: ORAL_TABLET | ORAL | 0 refills | Status: AC

## 2023-09-28 NOTE — Telephone Encounter (Signed)
 Reaching out to patient to offer assistance regarding upcoming cardiac imaging study; pt verbalizes understanding of appt date/time, parking situation and where to check in, pre-test NPO status and medications ordered, and verified current allergies; name and call back number provided for further questions should they arise Johney Frame RN Navigator Cardiac Imaging [facility] [phone] office [phone] cell

## 2023-09-29 ENCOUNTER — Ambulatory Visit
Admission: RE | Admit: 2023-09-29 | Discharge: 2023-09-29 | Disposition: A | Discharge status: Home / Self Care | Source: Ambulatory Visit | Attending: Internal Medicine | Admitting: Internal Medicine

## 2023-09-29 DIAGNOSIS — R0789 Other chest pain: Secondary | ICD-10-CM | POA: Diagnosis present

## 2023-09-29 DIAGNOSIS — I483 Typical atrial flutter: Secondary | ICD-10-CM | POA: Diagnosis present

## 2023-09-29 MED ORDER — IOHEXOL 350 MG/ML SOLN
80.0000 mL | Freq: Once | INTRAVENOUS | Status: AC | PRN
  Administered 2023-09-29: 75 mL via INTRAVENOUS

## 2023-09-29 MED ORDER — NITROGLYCERIN 0.4 MG SL SUBL
0.8000 mg | SUBLINGUAL_TABLET | Freq: Once | SUBLINGUAL | Status: AC
  Administered 2023-09-29: 0.8 mg via SUBLINGUAL
  Filled 2023-09-29: qty 25

## 2023-09-29 NOTE — Progress Notes (Signed)
 Patient tolerated CT well. Drank water after. Vital signs stable encourage to drink water throughout day.Reasons explained and verbalized understanding. Ambulated steady gait.

## 2024-02-20 ENCOUNTER — Ambulatory Visit

## 2024-06-06 ENCOUNTER — Other Ambulatory Visit: Payer: Self-pay | Admitting: Family Medicine

## 2024-06-06 DIAGNOSIS — M5416 Radiculopathy, lumbar region: Secondary | ICD-10-CM

## 2024-06-15 ENCOUNTER — Ambulatory Visit
Admission: RE | Admit: 2024-06-15 | Discharge: 2024-06-15 | Disposition: A | Source: Ambulatory Visit | Attending: Family Medicine | Admitting: Family Medicine

## 2024-06-15 DIAGNOSIS — M5416 Radiculopathy, lumbar region: Secondary | ICD-10-CM
